# Patient Record
Sex: Female | Born: 1970 | Race: Black or African American | Hispanic: No | Marital: Single | Smoking: Current every day smoker
Health system: Southern US, Community
[De-identification: ages and names within clinical notes are randomized; demographics above are authoritative.]

## PROBLEM LIST (undated history)

## (undated) DIAGNOSIS — M1990 Unspecified osteoarthritis, unspecified site: Secondary | ICD-10-CM

---

## 2004-03-08 ENCOUNTER — Emergency Department: Payer: Self-pay | Admitting: Emergency Medicine

## 2007-01-06 ENCOUNTER — Emergency Department: Payer: Self-pay | Admitting: Emergency Medicine

## 2008-06-12 ENCOUNTER — Emergency Department: Payer: Self-pay | Admitting: Emergency Medicine

## 2010-11-28 ENCOUNTER — Emergency Department: Payer: Self-pay | Admitting: Emergency Medicine

## 2010-12-04 ENCOUNTER — Emergency Department: Payer: Self-pay | Admitting: Emergency Medicine

## 2011-04-07 ENCOUNTER — Emergency Department: Payer: Self-pay | Admitting: Emergency Medicine

## 2012-03-02 ENCOUNTER — Emergency Department: Payer: Self-pay | Admitting: Emergency Medicine

## 2012-09-24 ENCOUNTER — Emergency Department: Payer: Self-pay | Admitting: Emergency Medicine

## 2012-09-24 LAB — CBC
MCH: 31.2 pg (ref 26.0–34.0)
MCHC: 33.1 g/dL (ref 32.0–36.0)
MCV: 94 fL (ref 80–100)
Platelet: 184 10*3/uL (ref 150–440)
RBC: 4.51 10*6/uL (ref 3.80–5.20)
WBC: 3.6 10*3/uL (ref 3.6–11.0)

## 2012-09-24 LAB — SEDIMENTATION RATE: Erythrocyte Sed Rate: 12 mm/hr (ref 0–20)

## 2013-03-04 ENCOUNTER — Emergency Department: Payer: Self-pay | Admitting: Emergency Medicine

## 2013-03-04 LAB — BASIC METABOLIC PANEL
Anion Gap: 3 — ABNORMAL LOW (ref 7–16)
Chloride: 104 mmol/L (ref 98–107)
Co2: 29 mmol/L (ref 21–32)
Glucose: 80 mg/dL (ref 65–99)
Potassium: 3.8 mmol/L (ref 3.5–5.1)

## 2013-03-04 LAB — CBC
HCT: 41.1 % (ref 35.0–47.0)
MCHC: 34 g/dL (ref 32.0–36.0)
RBC: 4.49 10*6/uL (ref 3.80–5.20)
RDW: 13.6 % (ref 11.5–14.5)

## 2013-03-04 LAB — SEDIMENTATION RATE: Erythrocyte Sed Rate: 10 mm/hr (ref 0–20)

## 2013-09-09 ENCOUNTER — Emergency Department: Payer: Self-pay | Admitting: Emergency Medicine

## 2013-09-09 LAB — CBC
HCT: 42.1 % (ref 35.0–47.0)
HGB: 14.1 g/dL (ref 12.0–16.0)
MCH: 30.2 pg (ref 26.0–34.0)
MCHC: 33.5 g/dL (ref 32.0–36.0)
MCV: 90 fL (ref 80–100)
Platelet: 221 10*3/uL (ref 150–440)
RBC: 4.67 10*6/uL (ref 3.80–5.20)
RDW: 13.7 % (ref 11.5–14.5)
WBC: 3.4 10*3/uL — AB (ref 3.6–11.0)

## 2013-09-09 LAB — CK TOTAL AND CKMB (NOT AT ARMC)
CK, Total: 48 U/L
CK-MB: 0.6 ng/mL (ref 0.5–3.6)

## 2013-09-09 LAB — COMPREHENSIVE METABOLIC PANEL
ALK PHOS: 68 U/L
Albumin: 3.1 g/dL — ABNORMAL LOW (ref 3.4–5.0)
Anion Gap: 5 — ABNORMAL LOW (ref 7–16)
BUN: 15 mg/dL (ref 7–18)
Bilirubin,Total: 0.6 mg/dL (ref 0.2–1.0)
CALCIUM: 8 mg/dL — AB (ref 8.5–10.1)
CHLORIDE: 107 mmol/L (ref 98–107)
CO2: 24 mmol/L (ref 21–32)
Creatinine: 0.73 mg/dL (ref 0.60–1.30)
EGFR (African American): >60
EGFR (Non-African Amer.): >60
GLUCOSE: 89 mg/dL (ref 65–99)
OSMOLALITY: 272 (ref 275–301)
Potassium: 3.5 mmol/L (ref 3.5–5.1)
SGOT(AST): 14 U/L — ABNORMAL LOW (ref 15–37)
SGPT (ALT): 15 U/L (ref 12–78)
Sodium: 136 mmol/L (ref 136–145)
TOTAL PROTEIN: 7.2 g/dL (ref 6.4–8.2)

## 2013-09-09 LAB — SEDIMENTATION RATE: ERYTHROCYTE SED RATE: 15 mm/h (ref 0–20)

## 2013-10-28 ENCOUNTER — Emergency Department: Payer: Self-pay | Admitting: Emergency Medicine

## 2013-10-28 LAB — URIC ACID: Uric Acid: 2.6 mg/dL (ref 2.6–6.0)

## 2013-12-28 ENCOUNTER — Emergency Department: Payer: Self-pay | Admitting: Internal Medicine

## 2013-12-28 LAB — COMPREHENSIVE METABOLIC PANEL
ALT: 14 U/L
AST: 17 U/L (ref 15–37)
Albumin: 3.2 g/dL — ABNORMAL LOW (ref 3.4–5.0)
Alkaline Phosphatase: 68 U/L
Anion Gap: 7 (ref 7–16)
BUN: 16 mg/dL (ref 7–18)
Bilirubin,Total: 0.5 mg/dL (ref 0.2–1.0)
CALCIUM: 8.8 mg/dL (ref 8.5–10.1)
Chloride: 106 mmol/L (ref 98–107)
Co2: 23 mmol/L (ref 21–32)
Creatinine: 0.81 mg/dL (ref 0.60–1.30)
EGFR (African American): >60
GLUCOSE: 97 mg/dL (ref 65–99)
Osmolality: 273 (ref 275–301)
Potassium: 3.5 mmol/L (ref 3.5–5.1)
Sodium: 136 mmol/L (ref 136–145)
Total Protein: 7.5 g/dL (ref 6.4–8.2)

## 2013-12-28 LAB — CBC
HCT: 39.6 % (ref 35.0–47.0)
HGB: 12.9 g/dL (ref 12.0–16.0)
MCH: 29.1 pg (ref 26.0–34.0)
MCHC: 32.5 g/dL (ref 32.0–36.0)
MCV: 89 fL (ref 80–100)
PLATELETS: 241 10*3/uL (ref 150–440)
RBC: 4.43 10*6/uL (ref 3.80–5.20)
RDW: 14.2 % (ref 11.5–14.5)
WBC: 4.1 10*3/uL (ref 3.6–11.0)

## 2013-12-28 LAB — URINALYSIS, COMPLETE
Bilirubin,UR: NEGATIVE
Blood: NEGATIVE
Glucose,UR: NEGATIVE mg/dL (ref 0–75)
KETONE: NEGATIVE
Nitrite: NEGATIVE
Ph: 6 (ref 4.5–8.0)
Protein: NEGATIVE
SPECIFIC GRAVITY: 1.021 (ref 1.003–1.030)
WBC UR: <1 /HPF (ref 0–5)

## 2013-12-28 LAB — DRUG SCREEN, URINE
AMPHETAMINES, UR SCREEN: NEGATIVE (ref ?–1000)
Barbiturates, Ur Screen: NEGATIVE (ref ?–200)
Benzodiazepine, Ur Scrn: NEGATIVE (ref ?–200)
CANNABINOID 50 NG, UR ~~LOC~~: NEGATIVE (ref ?–50)
Cocaine Metabolite,Ur ~~LOC~~: POSITIVE (ref ?–300)
MDMA (Ecstasy)Ur Screen: NEGATIVE (ref ?–500)
Methadone, Ur Screen: NEGATIVE (ref ?–300)
Opiate, Ur Screen: NEGATIVE (ref ?–300)
PHENCYCLIDINE (PCP) UR S: NEGATIVE (ref ?–25)
TRICYCLIC, UR SCREEN: NEGATIVE (ref ?–1000)

## 2013-12-28 LAB — SEDIMENTATION RATE: ERYTHROCYTE SED RATE: 16 mm/h (ref 0–20)

## 2014-01-30 ENCOUNTER — Emergency Department: Payer: Self-pay | Admitting: Emergency Medicine

## 2014-10-19 ENCOUNTER — Emergency Department
Admission: EM | Admit: 2014-10-19 | Discharge: 2014-10-19 | Disposition: A | Discharge status: Home / Self Care | Payer: Self-pay | Attending: Emergency Medicine | Admitting: Emergency Medicine

## 2014-10-19 ENCOUNTER — Encounter: Payer: Self-pay | Admitting: *Deleted

## 2014-10-19 DIAGNOSIS — G8929 Other chronic pain: Secondary | ICD-10-CM | POA: Insufficient documentation

## 2014-10-19 DIAGNOSIS — M2550 Pain in unspecified joint: Secondary | ICD-10-CM

## 2014-10-19 DIAGNOSIS — Z720 Tobacco use: Secondary | ICD-10-CM | POA: Insufficient documentation

## 2014-10-19 DIAGNOSIS — M25511 Pain in right shoulder: Secondary | ICD-10-CM | POA: Insufficient documentation

## 2014-10-19 DIAGNOSIS — M79641 Pain in right hand: Secondary | ICD-10-CM | POA: Insufficient documentation

## 2014-10-19 DIAGNOSIS — M79642 Pain in left hand: Secondary | ICD-10-CM | POA: Insufficient documentation

## 2014-10-19 HISTORY — DX: Unspecified osteoarthritis, unspecified site: M19.90

## 2014-10-19 MED ORDER — PREDNISONE 10 MG PO TABS: 10.0000 mg | ORAL_TABLET | Freq: Every day | ORAL | Status: AC

## 2014-10-19 MED ORDER — METHYLPREDNISOLONE SODIUM SUCC 125 MG IJ SOLR
80.0000 mg | Freq: Once | INTRAMUSCULAR | Status: AC
  Administered 2014-10-19: 80 mg via INTRAMUSCULAR

## 2014-10-19 MED ORDER — MORPHINE SULFATE 4 MG/ML IJ SOLN
INTRAMUSCULAR | Status: AC
  Administered 2014-10-19: 4 mg via INTRAMUSCULAR
  Filled 2014-10-19: qty 1

## 2014-10-19 MED ORDER — OXYCODONE-ACETAMINOPHEN 5-325 MG PO TABS: 1.0000 | ORAL_TABLET | ORAL | Status: DC | PRN

## 2014-10-19 MED ORDER — MORPHINE SULFATE 4 MG/ML IJ SOLN
4.0000 mg | Freq: Once | INTRAMUSCULAR | Status: AC
  Administered 2014-10-19: 4 mg via INTRAMUSCULAR

## 2014-10-19 MED ORDER — METHYLPREDNISOLONE SODIUM SUCC 125 MG IJ SOLR
INTRAMUSCULAR | Status: AC
  Administered 2014-10-19: 80 mg via INTRAMUSCULAR
  Filled 2014-10-19: qty 2

## 2014-10-19 NOTE — ED Provider Notes (Signed)
[facility] Emergency Department Provider Note  ____________________________________________  Time seen: Approximately 5:33 AM  I have reviewed the triage vital signs and the nursing notes.   HISTORY  Chief Complaint Shoulder Pain    HPI Katherine Greene is a 44 y.o. female who presents with exacerbation of 10/10 right shoulder pain 2-1/2 weeks with no known injury. 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 Patient had a positive AMA and is scheduled to follow up with rheumatology. Patient states in the past she has taken prednisone 10 mg with good relief of symptoms. She has been out of prednisone for several months. She was prescribed Voltaren 75 mg twice a day which she says does not relieve her pain.Denies fever, chills, chest pain, shortness of breath, abdominal pain, vomiting, diarrhea, headache, weakness, numbness, tingling.   Past Medical History  Diagnosis Date  . Arthritis     There are no active problems to display for this patient.   History reviewed. No pertinent past surgical history.    Allergies Review of patient's allergies indicates no known allergies.  No family history on file.  Social History History  Substance Use Topics  . Smoking status: Current Every Day Smoker -- 1.00 packs/day for 10 years    Types: Cigarettes  . Smokeless tobacco: Not on file  . Alcohol Use: Yes     Comment: special occasion    Review of Systems Constitutional: No fever/chills Eyes: No visual changes. ENT: No sore throat. Cardiovascular: Denies chest pain. Respiratory: Denies shortness of breath. Gastrointestinal: No abdominal pain.  No nausea, no vomiting.  No diarrhea.  No constipation. Genitourinary: Negative for dysuria. Musculoskeletal: Positive for chronic multiple joint pain; particularly right shoulder 2-1/2 weeks. Skin: Negative for  rash. Neurological: Negative for headaches, focal weakness or numbness.  10-point ROS otherwise negative.  ____________________________________________   PHYSICAL EXAM:  VITAL SIGNS: ED Triage Vitals  Enc Vitals Group     BP 10/19/14 0405 126/84 mmHg     Pulse Rate 10/19/14 0405 82     Resp 10/19/14 0405 18     Temp 10/19/14 0405 98.2 F (36.8 C)     Temp Source 10/19/14 0405 Oral     SpO2 10/19/14 0405 100 %     Weight 10/19/14 0405 135 lb (61.236 kg)     Height 10/19/14 0405  (1.626 m)     Head Cir --      Peak Flow --      Pain Score 10/19/14 0406 10     Pain Loc --      Pain Edu? --      Excl. in GC? --     Constitutional: Alert and oriented. Well appearing and in mild acute distress. Eyes: Conjunctivae are normal. PERRL. EOMI. Head: Atraumatic. Nose: No congestion/rhinnorhea. Mouth/Throat: Mucous membranes are moist.  Oropharynx non-erythematous. Neck: No stridor.   Cardiovascular: Normal rate, regular rhythm. Grossly normal heart sounds.  Good peripheral circulation. Respiratory: Normal respiratory effort.  No retractions. Lungs CTAB. Gastrointestinal: Soft and nontender. No distention. No abdominal bruits. No CVA tenderness. Musculoskeletal: Right shoulder tender to palpation without deformity. Limited range of motion due to pain. Multiple arthritic phalanges bilateral hands. No lower extremity tenderness nor edema.  No joint effusions. Neurologic:  Normal speech and language. No gross focal neurologic deficits are appreciated. Speech is normal. No gait instability. Skin:  Skin is warm, dry and intact.  No rash noted. Psychiatric: Mood and affect are normal. Speech and behavior are normal.  ____________________________________________   LABS (all labs ordered are listed, but only abnormal results are displayed)  Labs Reviewed - No data to  display ____________________________________________  EKG  None ____________________________________________  RADIOLOGY  None ____________________________________________   PROCEDURES  Procedure(s) performed: None  Critical Care performed: No  ____________________________________________   INITIAL IMPRESSION / ASSESSMENT AND PLAN / ED COURSE  Pertinent labs & imaging results that were available during my care of the patient were reviewed by me and considered in my medical decision making (see chart for details).  44 year old female with multiple joint pain, especially in right shoulder undergoing rheumatological workup. Will add analgesic to NSAID, starts low dose prednisone, patient will follow-up with [facility] rheumatology as scheduled. Strict return precautions given. Patient verbalizes understanding and agrees with plan of care. ____________________________________________   FINAL CLINICAL IMPRESSION(S) / ED DIAGNOSES  Final diagnoses:  Right shoulder pain  Multiple joint pain  Arthralgia      Irean Hong, MD 10/19/14 [phone]

## 2014-10-19 NOTE — Discharge Instructions (Signed)
1. Continue Voltaren as previously prescribed. 2. You may take Percocet (#20) as needed for more severe pain. 3. Start prednisone 10 mg daily (#15). 4. Return to the ER for worsening symptoms, fever, persistent vomiting, difficulty breathing or other concerns.  Shoulder Pain The shoulder is the joint that connects your arm to your body. Muscles and band-like tissues that connect bones to muscles (tendons) hold the joint together. Shoulder pain is felt if an injury or medical problem affects one or more parts of the shoulder. HOME CARE   Put ice on the sore area.  Put ice in a plastic bag.  Place a towel between your skin and the bag.  Leave the ice on for 15-20 minutes, 03-04 times a day for the first 2 days.  Stop using cold packs if they do not help with the pain.  If you were given something to keep your shoulder from moving (sling; shoulder immobilizer), wear it as told. Only take it off to shower or bathe.  Move your arm as little as possible, but keep your hand moving to prevent puffiness (swelling).  Squeeze a soft ball or foam pad as much as possible to help prevent swelling.  Take medicine as told by your doctor. GET HELP IF:  You have progressing new pain in your arm, hand, or fingers.  Your hand or fingers get cold.  Your medicine does not help lessen your pain. GET HELP RIGHT AWAY IF:   Your arm, hand, or fingers are numb or tingling.  Your arm, hand, or fingers are puffy (swollen), painful, or turn white or blue. MAKE SURE YOU:   Understand these instructions.  Will watch your condition.  Will get help right away if you are not doing well or get worse. Document Released: 09/30/2007 Document Revised: 08/28/2013 Document Reviewed: 10/26/2011 [facility] Patient Information 2015 [place], [place]. This information is not intended to replace advice given to you by your health care provider. Make sure you discuss any questions you have with your health care  provider.  Arthralgia Your caregiver has diagnosed you as suffering from an arthralgia. Arthralgia means there is pain in a joint. This can come from many reasons including:  Bruising the joint which causes soreness (inflammation) in the joint.  Wear and tear on the joints which occur as we grow older (osteoarthritis).  Overusing the joint.  Various forms of arthritis.  Infections of the joint. Regardless of the cause of pain in your joint, most of these different pains respond to anti-inflammatory drugs and rest. The exception to this is when a joint is infected, and these cases are treated with antibiotics, if it is a bacterial infection. HOME CARE INSTRUCTIONS   Rest the injured area for as long as directed by your caregiver. Then slowly start using the joint as directed by your caregiver and as the pain allows. Crutches as directed may be useful if the ankles, knees or hips are involved. If the knee was splinted or casted, continue use and care as directed. If an stretchy or elastic wrapping bandage has been applied today, it should be removed and re-applied every 3 to 4 hours. It should not be applied tightly, but firmly enough to keep swelling down. Watch toes and feet for swelling, bluish discoloration, coldness, numbness or excessive pain. If any of these problems (symptoms) occur, remove the ace bandage and re-apply more loosely. If these symptoms persist, contact your caregiver or return to this location.  For the first 24 hours, keep the  injured extremity elevated on pillows while lying down.  Apply ice for 15-20 minutes to the sore joint every couple hours while awake for the first half day. Then 03-04 times per day for the first 48 hours. Put the ice in a plastic bag and place a towel between the bag of ice and your skin.  Wear any splinting, casting, elastic bandage applications, or slings as instructed.  Only take over-the-counter or prescription medicines for pain,  discomfort, or fever as directed by your caregiver. Do not use aspirin immediately after the injury unless instructed by your physician. Aspirin can cause increased bleeding and bruising of the tissues.  If you were given crutches, continue to use them as instructed and do not resume weight bearing on the sore joint until instructed. Persistent pain and inability to use the sore joint as directed for more than 2 to 3 days are warning signs indicating that you should see a caregiver for a follow-up visit as soon as possible. Initially, a hairline fracture (break in bone) may not be evident on X-rays. Persistent pain and swelling indicate that further evaluation, non-weight bearing or use of the joint (use of crutches or slings as instructed), or further X-rays are indicated. X-rays may sometimes not show a small fracture until a week or 10 days later. Make a follow-up appointment with your own caregiver or one to whom we have referred you. A radiologist (specialist in reading X-rays) may read your X-rays. Make sure you know how you are to obtain your X-ray results. Do not assume everything is normal if you do not hear from [place]. SEEK MEDICAL CARE IF: Bruising, swelling, or pain increases. SEEK IMMEDIATE MEDICAL CARE IF:   Your fingers or toes are numb or blue.  The pain is not responding to medications and continues to stay the same or get worse.  The pain in your joint becomes severe.  You develop a fever over 102 F (38.9 C).  It becomes impossible to move or use the joint. MAKE SURE YOU:   Understand these instructions.  Will watch your condition.  Will get help right away if you are not doing well or get worse. Document Released: 04/13/2005 Document Revised: 07/06/2011 Document Reviewed: 11/30/2007 [facility] Patient Information 2015 [place], [place]. This information is not intended to replace advice given to you by your health care provider. Make sure you discuss any questions you have with  your health care provider.

## 2014-10-19 NOTE — ED Notes (Signed)
Pt c/o right shoulder pain x 2.5 weeks with no injury. Pt was seen for the same at [facility] on 6/13 and was dx with arthritis. Pt states she took with medication they gave her tonight without relief. Pt was given Volatren 75mg  EC BID. Pt also c/o pain in her knees and other joints.

## 2015-04-29 IMAGING — US US EXTREM UP VENOUS*L*
1 series · 13 of 24 positions shown · non-contrast
Comparison: None.

CLINICAL DATA: Swelling left arm.  Initially visit.



[Series 1: us extrem up venous*left* · 0.05mm/px · 13 of 39 slices shown]
[im 1/39]
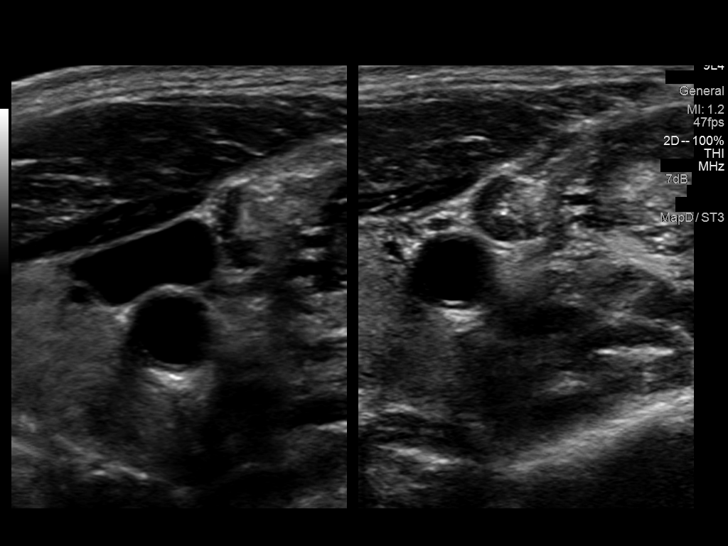
[im 4/39]
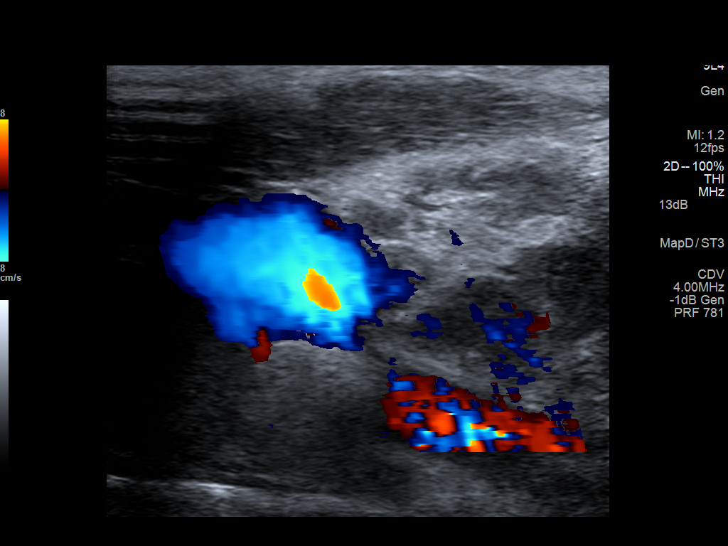
[im 7/39]
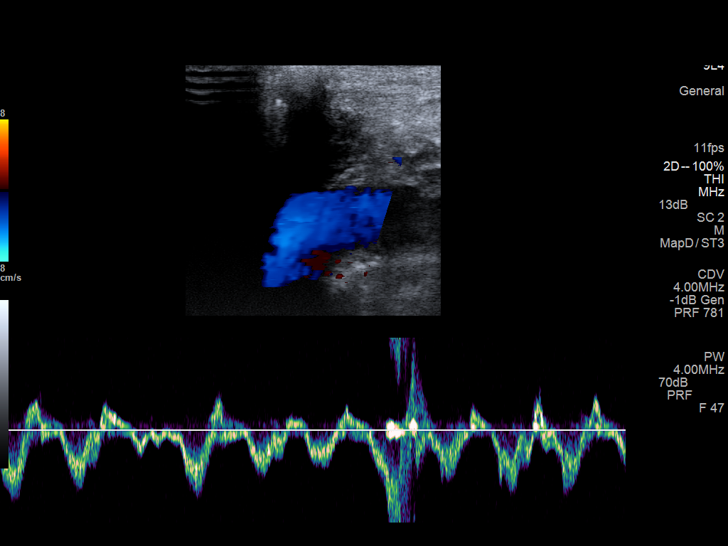
[im 10/39]
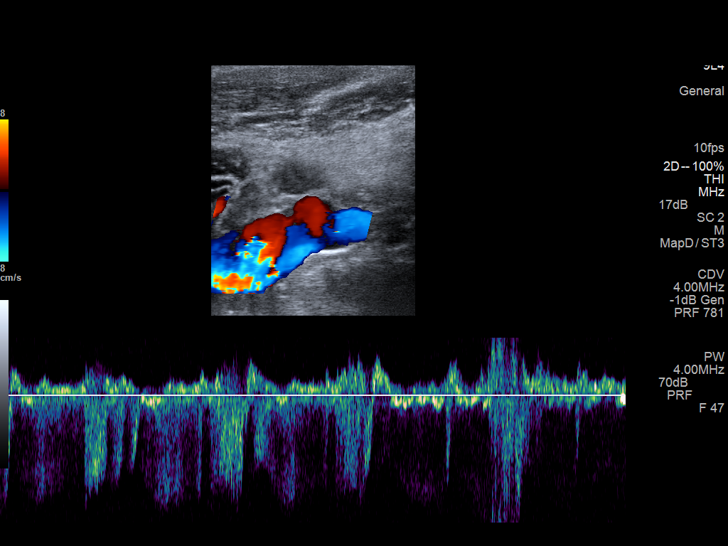
[im 14/39]
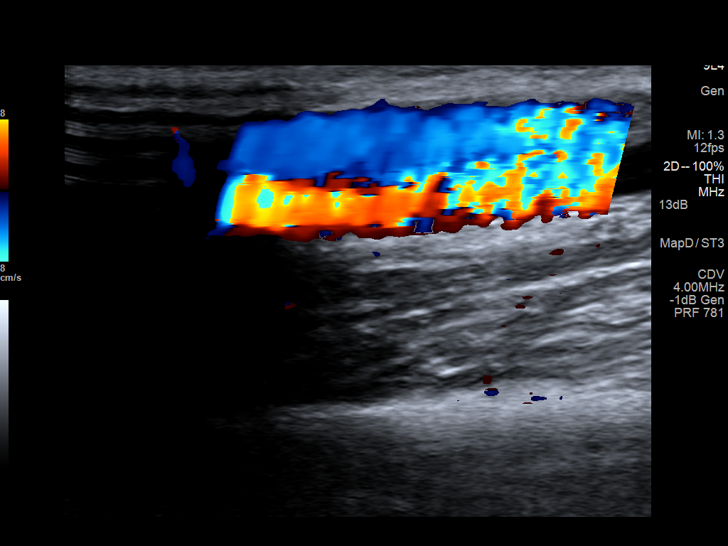
[im 17/39]
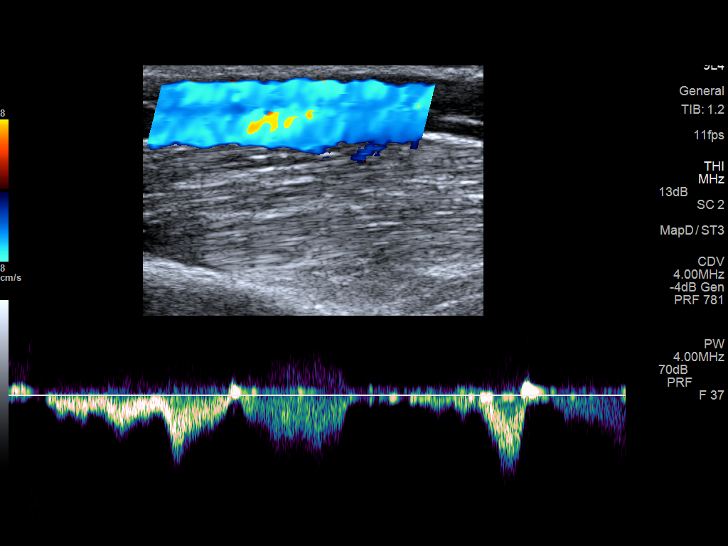
[im 20/39]
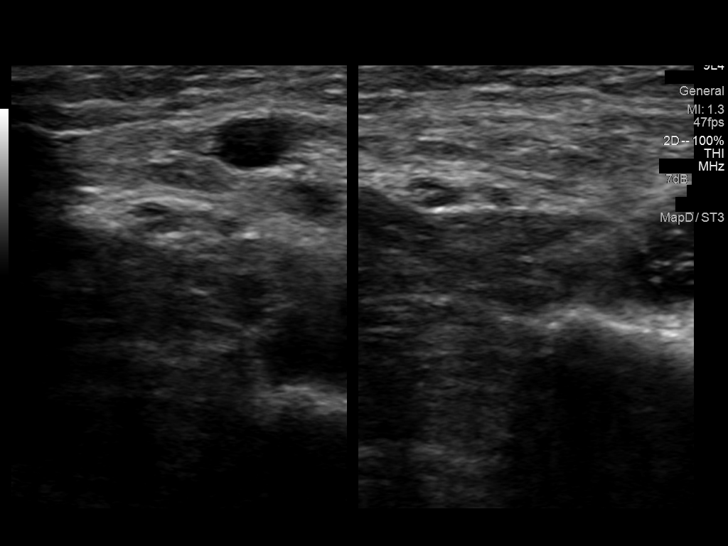
[im 22/39]
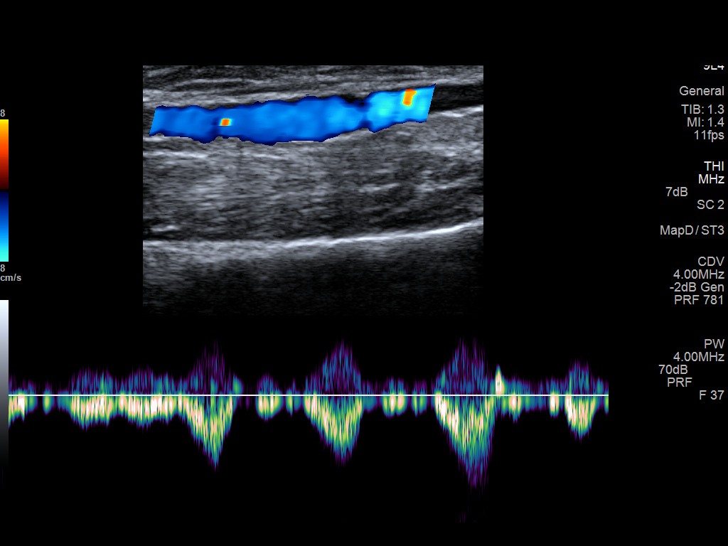
[im 25/39]
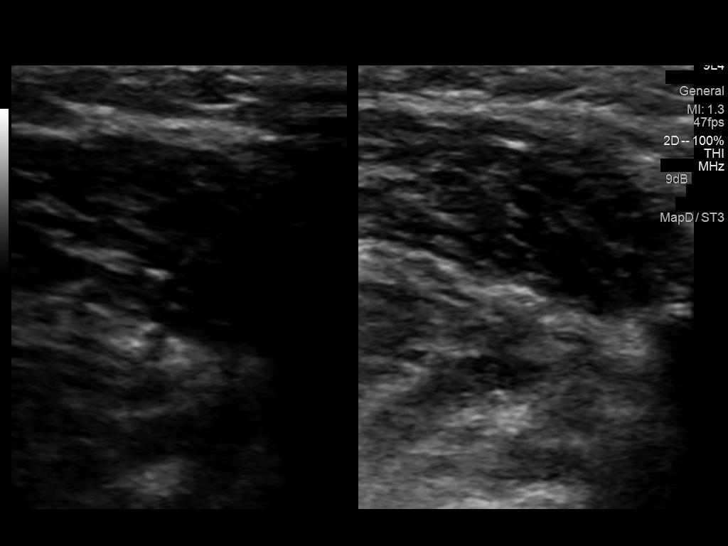
[im 29/39]
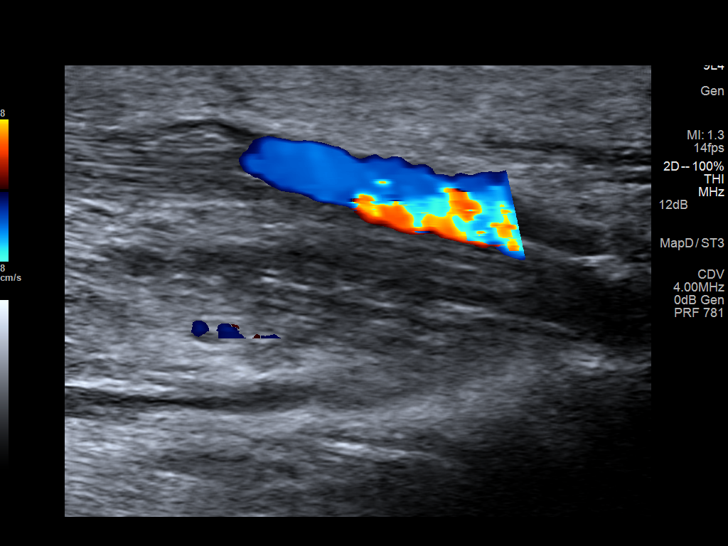
[im 32/39]
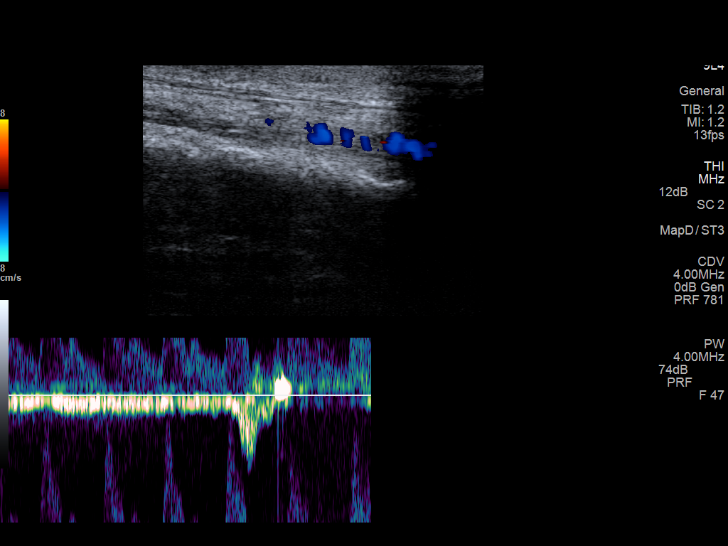
[im 35/39]
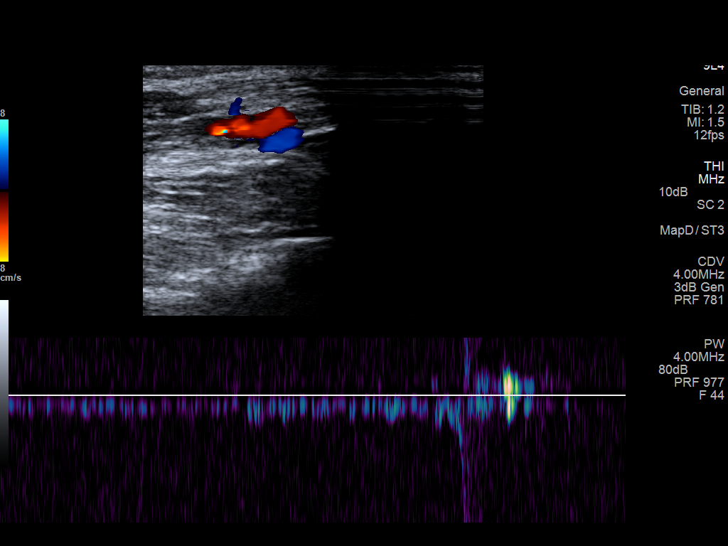
[im 39/39]
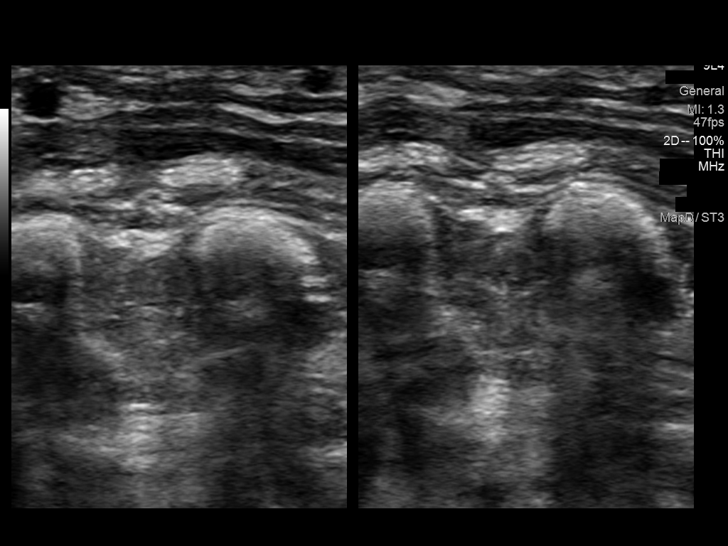

[13 of 24 positions shown; findings below may reference images not displayed]

FINDINGS: Internal Jugular Vein: No evidence of thrombus. Normal
compressibility, respiratory phasicity and response to augmentation.

Subclavian Vein: No evidence of thrombus. Normal compressibility,
respiratory phasicity and response to augmentation.

Axillary Vein: No evidence of thrombus. Normal compressibility,
respiratory phasicity and response to augmentation.

Cephalic Vein: No evidence of thrombus. Normal compressibility,
respiratory phasicity and response to augmentation.

Basilic Vein: No evidence of thrombus. Normal compressibility,
respiratory phasicity and response to augmentation.

Brachial Veins: No evidence of thrombus. Normal compressibility,
respiratory phasicity and response to augmentation.

Radial Veins: No evidence of thrombus. Normal compressibility,
respiratory phasicity and response to augmentation.

Ulnar Veins: No evidence of thrombus. Normal compressibility,
respiratory phasicity and response to augmentation.

Venous Reflux:  None visualized.

Other Findings:  None visualized.
IMPRESSION: No evidence of deep venous thrombosis.

## 2016-06-06 ENCOUNTER — Emergency Department
Admission: EM | Admit: 2016-06-06 | Discharge: 2016-06-06 | Disposition: A | Discharge status: Home / Self Care | Payer: Self-pay | Attending: Emergency Medicine | Admitting: Emergency Medicine

## 2016-06-06 ENCOUNTER — Encounter: Payer: Self-pay | Admitting: Emergency Medicine

## 2016-06-06 DIAGNOSIS — K047 Periapical abscess without sinus: Secondary | ICD-10-CM | POA: Insufficient documentation

## 2016-06-06 DIAGNOSIS — Z79899 Other long term (current) drug therapy: Secondary | ICD-10-CM | POA: Insufficient documentation

## 2016-06-06 DIAGNOSIS — F17210 Nicotine dependence, cigarettes, uncomplicated: Secondary | ICD-10-CM | POA: Insufficient documentation

## 2016-06-06 MED ORDER — NAPROXEN 500 MG PO TABS: 500.0000 mg | ORAL_TABLET | Freq: Two times a day (BID) | ORAL | 0 refills | Status: DC

## 2016-06-06 MED ORDER — HYDROCODONE-ACETAMINOPHEN 5-325 MG PO TABS
1.0000 | ORAL_TABLET | Freq: Once | ORAL | Status: AC
  Administered 2016-06-06: 1 via ORAL
  Filled 2016-06-06: qty 1

## 2016-06-06 MED ORDER — AMOXICILLIN 500 MG PO CAPS: 500.0000 mg | ORAL_CAPSULE | Freq: Three times a day (TID) | ORAL | 0 refills | Status: AC

## 2016-06-06 NOTE — ED Notes (Signed)
Pt verbalizes understanding of discharge instructions.

## 2016-06-06 NOTE — Discharge Instructions (Signed)
OPTIONS FOR DENTAL FOLLOW UP CARE ° °[place] [facility] - Local Safety Net Dental Clinics °http://www.ncdhhs.gov/dph/oralhealth/services/safetynetclinics.htm °  °[facility] (336-562-3123) ° °[facility] [place] (919-933-9087) ° °[facility] (919-663-1744 ext [phone]) ° °[facility] ([phone]) ° °[facility] ([phone]) °This clinic caters to the indigent population and is on a lottery system. °Location: °[facility], [address], [place] °Clinic Hours: °Wednesdays from 6pm - 9pm, patients seen by a lottery system. °For dates, call or go to www.med.[facility].edu/[facility]/patients/Dental-[facility] °Services: °Cleanings, fillings and simple extractions. °Payment Options: °DENTAL WORK IS FREE OF CHARGE. Bring proof of income or support. °Best way to get seen: °Arrive at 5:15 pm - this is a lottery, NOT first come/first serve, so arriving earlier will not increase your chances of being seen. °  °  °[facility] °919-537-3737 °Select option 1 for emergencies °  °Location: °[facility], [address], [place] °Clinic Hours: °No walk-ins accepted - call the day before to schedule an appointment. °Check in times are 9:30 am and 1:30 pm. °Services: °Simple extractions, temporary fillings, pulpectomy/pulp debridement, uncomplicated abscess drainage. °Payment Options: °PAYMENT IS DUE AT THE TIME OF SERVICE.  Fee is usually $100-200, additional surgical procedures (e.g. abscess drainage) may be extra. °Cash, checks, Visa/MasterCard accepted.  Can file Medicaid if patient is covered for dental - patient should call case worker to check. °No discount for [facility] patients. °Best way to get seen: °MUST call the day before and get onto the schedule. Can usually be seen the next 1-2 days. No walk-ins accepted. °  °  °[facility] °[phone] °   °Location: °[facility], [address], [place] °Clinic Hours: °M, W, Th, F 8am or 1:30pm, Tues 9a or 1:30 - first come/first served. °Services: °Simple extractions, temporary fillings, uncomplicated abscess drainage.  You do not need to be an [place] resident. °Payment Options: °PAYMENT IS DUE AT THE TIME OF SERVICE. °Dental insurance, otherwise sliding scale - bring proof of income or support. °Depending on income and treatment needed, cost is usually $50-200. °Best way to get seen: °Arrive early as it is first come/first served. °  °  °[facility] °[phone] °  °Location: °[address] °Clinic Hours: °Mon-Thu 8a-5p °Services: °Most basic dental services including extractions and fillings. °Payment Options: °PAYMENT IS DUE AT THE TIME OF SERVICE. °Sliding scale, up to 50% off - bring proof if income or support. °Medicaid with dental option accepted. °Best way to get seen: °Call to schedule an appointment, can usually be seen within 2 weeks OR they will try to see walk-ins - show up at 8a or 2p (you may have to wait). °  °  °[facility] °919-245-2435 °[place] RESIDENTS ONLY °  °Location: °[facility], [address], [place], [place] [address] °Clinic Hours: By appointment only. °Monday - Thursday 8am-5pm, Friday 8am-12pm °Services: Cleanings, fillings, extractions. °Payment Options: °PAYMENT IS DUE AT THE TIME OF SERVICE. °Cash, Visa or MasterCard. Sliding scale - $30 minimum per service. °Best way to get seen: °Come in to office, complete packet and make an appointment - need proof of income °or support monies for each household member and proof of [place] residence. °Usually takes about a month to get in. °  °  °[facility] °[phone] °  °Location: °[address]   [place] °Clinic Hours: Walk-in Urgent Care Dental Services are offered Monday-Friday  mornings only. °The numbers of emergencies accepted daily is limited to the number of °providers available. °Maximum 15 - Mondays, Wednesdays & Thursdays °Maximum 10 - Tuesdays & Fridays °Services: °You do not need to be a [place] resident to be seen for a dental emergency. °Emergencies are defined as pain, swelling, abnormal bleeding, or dental trauma. Walkins will receive x-rays if needed. °NOTE: Dental cleaning is not an emergency. °Payment Options: °PAYMENT IS DUE AT THE TIME OF SERVICE. °Minimum co-pay is $40.00 for uninsured patients. °Minimum co-pay is $3.00 for Medicaid with dental coverage. °Dental Insurance is accepted and must be presented at time of visit. °Medicare does not cover dental. °Forms of payment: Cash, credit card, checks. °Best way to get seen: °If not previously registered with the clinic, walk-in dental registration begins at 7:15 am and is on a first come/first serve basis. °If previously registered with the clinic, call to make an appointment. °  °  °[facility] °919-776-4359 °[place] RESIDENTS ONLY °  °Location: °[address], [place], [place] °Clinic Hours: °Mon-Thu 10a-2p °Services: Extractions only! °Payment Options: °FREE (donations accepted) - bring proof of income or support °Best way to get seen: °Call and schedule an appointment OR come at 8am on the 1st Monday of every month (except for holidays) when it is first come/first served. °  °  °[facility] °[phone] °  °Location: °[address], [place] °Clinic Hours: °Friday mornings °Services, Payment Options, Best way to get seen: °Call for info °

## 2016-06-06 NOTE — ED Provider Notes (Signed)
[facility] Emergency Department Provider Note  ____________________________________________  Time seen: Approximately 3:30 PM  I have reviewed the triage vital signs and the nursing notes.   HISTORY  Chief Complaint Facial Swelling    HPI Katherine Greene is a 46 y.o. female that presents to the emergency department with lower left tooth pain for 2 days. Patient states that she has not seen a dentist in many months.Patient states that it is very tender around one of her lower teeth. Patient states that she has two loose teeth in the front. Patient denies fever, drainage from mouth, jaw pain, ear pain, throat closing, cough, shortness of breath, chest pain, nausea, vomiting, diarrhea, constipation.   Past Medical History:  Diagnosis Date  . Arthritis     There are no active problems to display for this patient.   History reviewed. No pertinent surgical history.  Prior to Admission medications   Medication Sig Start Date End Date Taking? Authorizing Provider  amoxicillin (AMOXIL) 500 MG capsule Take 1 capsule (500 mg total) by mouth 3 (three) times daily. 06/06/16 06/16/16  Enid Derry, PA-C  diclofenac (VOLTAREN) 75 MG EC tablet Take 75 mg by mouth 2 (two) times daily.    Historical Provider, MD  naproxen (NAPROSYN) 500 MG tablet Take 1 tablet (500 mg total) by mouth 2 (two) times daily with a meal. 06/06/16 06/06/17  Enid Derry, PA-C  oxyCODONE-acetaminophen (ROXICET) 5-325 MG per tablet Take 1 tablet by mouth every 4 (four) hours as needed for severe pain. 10/19/14   Irean Hong, MD  predniSONE (DELTASONE) 10 MG tablet Take 1 tablet (10 mg total) by mouth daily. 10/19/14   Irean Hong, MD    Allergies Patient has no known allergies.  History reviewed. No pertinent family history.  Social History Social History  Substance Use Topics  . Smoking status: Current Every Day Smoker    Packs/day: 1.00    Years: 10.00    Types: Cigarettes  .  Smokeless tobacco: Never Used  . Alcohol use Yes     Comment: special occasion     Review of Systems  Constitutional: No fever/chills ENT: No upper respiratory complaints. Cardiovascular: No chest pain. Respiratory: No cough. No SOB. Gastrointestinal: No abdominal pain.  No nausea, no vomiting.  Musculoskeletal: Negative for musculoskeletal pain. Skin: Negative for rash, abrasions, lacerations, ecchymosis. Neurological: Negative for headaches, numbness or tingling   ____________________________________________   PHYSICAL EXAM:  VITAL SIGNS: ED Triage Vitals [06/06/16 1344]  Enc Vitals Group     BP 119/74     Pulse Rate 89     Resp 20     Temp 98.1 F (36.7 C)     Temp Source Oral     SpO2 100 %     Weight 145 lb (65.8 kg)     Height 5\' 4"  (1.626 m)     Head Circumference      Peak Flow      Pain Score 9     Pain Loc      Pain Edu?      Excl. in GC?      Constitutional: Alert and oriented. Well appearing and in no acute distress. Eyes: Conjunctivae are normal. PERRL. EOMI. Head: Atraumatic. ENT:      Ears:      Nose: No congestion/rhinnorhea.      Mouth/Throat: Mucous membranes are moist. Oropharynx non-erythematous. Tonsils not enlarged. Uvula midline. Tenderness to palpation around tooth 22. Tooth #24, 25 loose. No  areas of fluctuance around buccal or mucosal surfaces. No drainage from mouth. No TMJ pain. No trismus. No areas of swelling over cheek or chin. Neck: No stridor.   Cardiovascular: Normal rate, regular rhythm.  Good peripheral circulation. Respiratory: Normal respiratory effort without tachypnea or retractions. Lungs CTAB. Good air entry to the bases with no decreased or absent breath sounds. Musculoskeletal: Full range of motion to all extremities. No gross deformities appreciated. Neurologic:  Normal speech and language. No gross focal neurologic deficits are appreciated.  Skin:  Skin is warm, dry and intact. No rash  noted.   ____________________________________________   LABS (all labs ordered are listed, but only abnormal results are displayed)  Labs Reviewed - No data to display ____________________________________________  EKG   ____________________________________________  RADIOLOGY   No results found.  ____________________________________________    PROCEDURES  Procedure(s) performed:    Procedures    Medications  HYDROcodone-acetaminophen (NORCO/VICODIN) 5-325 MG per tablet 1 tablet (1 tablet Oral Given 06/06/16 1558)     ____________________________________________   INITIAL IMPRESSION / ASSESSMENT AND PLAN / ED COURSE  Pertinent labs & imaging results that were available during my care of the patient were reviewed by me and considered in my medical decision making (see chart for details).  Review of the [place] CSRS was performed in accordance of the [facility] prior to dispensing any controlled drugs.   Patient's diagnosis is consistent with dental infection. Vital signs and exam are reassuring. No indication of Ludwig's angina. No drainable abscess. Patient will be discharged home with prescriptions for amoxicillin and naproxen. Patient's family member came out to ask for a  prescription for Percocet or Vicodin for patient's pain. I explained that this was not indicated and was only going to give a prescription for naproxen. Patient is to follow up with dentist as directed. Patient is given ED precautions to return to the ED for any worsening or new symptoms.  ____________________________________________  FINAL CLINICAL IMPRESSION(S) / ED DIAGNOSES  Final diagnoses:  Tooth infection      NEW MEDICATIONS STARTED DURING THIS VISIT:  Discharge Medication List as of 06/06/2016  3:51 PM    START taking these medications   Details  amoxicillin (AMOXIL) 500 MG capsule Take 1 capsule (500 mg total) by mouth 3 (three) times daily., Starting Sat 06/06/2016, Until Tue  06/16/2016, Print    naproxen (NAPROSYN) 500 MG tablet Take 1 tablet (500 mg total) by mouth 2 (two) times daily with a meal., Starting Sat 06/06/2016, Until Sun 06/06/2017, Print            This chart was dictated using voice recognition software/Dragon. Despite best efforts to proofread, errors can occur which can change the meaning. Any change was purely unintentional.    Enid DerryAshley Kailoni Vahle, PA-C 06/06/16 2133    Phineas SemenGraydon Goodman, MD 06/06/16 2149

## 2016-06-06 NOTE — ED Triage Notes (Signed)
Pt to ed with c/o left sided facial swelling x 2 days. Denies difficulty breathing or swallowing.

## 2016-06-06 NOTE — ED Notes (Signed)
Pt reports she can't take Ibuprofen because of her liver RN and PA explained that if she has a problem with her liver Tylenol will be contraindicated pt reports she wants to take Vicodin, explained to f/u with her PCP

## 2017-04-03 ENCOUNTER — Emergency Department
Admission: EM | Admit: 2017-04-03 | Discharge: 2017-04-03 | Disposition: A | Discharge status: Home / Self Care | Payer: Self-pay | Attending: Emergency Medicine | Admitting: Emergency Medicine

## 2017-04-03 ENCOUNTER — Other Ambulatory Visit: Payer: Self-pay

## 2017-04-03 ENCOUNTER — Encounter: Payer: Self-pay | Admitting: Emergency Medicine

## 2017-04-03 DIAGNOSIS — F17210 Nicotine dependence, cigarettes, uncomplicated: Secondary | ICD-10-CM | POA: Insufficient documentation

## 2017-04-03 DIAGNOSIS — M25511 Pain in right shoulder: Secondary | ICD-10-CM | POA: Insufficient documentation

## 2017-04-03 DIAGNOSIS — Z79899 Other long term (current) drug therapy: Secondary | ICD-10-CM | POA: Insufficient documentation

## 2017-04-03 NOTE — ED Provider Notes (Signed)
[facility] Emergency Department Provider Note  ____________________________________________  Time seen: Approximately 8:51 AM  I have reviewed the triage vital signs and the nursing notes.   HISTORY  Chief Complaint Shoulder Pain    HPI Katherine Greene is a 46 y.o. female that presents to the emergency department for a work note.  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  She does not want an exam or any medications.  She is here for a work note.  Past Medical History:  Diagnosis Date  . Arthritis     There are no active problems to display for this patient.   History reviewed. No pertinent surgical history.  Prior to Admission medications   Medication Sig Start Date End Date Taking? Authorizing Provider  diclofenac (VOLTAREN) 75 MG EC tablet Take 75 mg by mouth 2 (two) times daily.    [provider]  naproxen (NAPROSYN) 500 MG tablet Take 1 tablet (500 mg total) by mouth 2 (two) times daily with a meal. 06/06/16 06/06/17  Enid DerryWagner, Bernice Mcauliffe, PA-C  oxyCODONE-acetaminophen (ROXICET) 5-325 MG per tablet Take 1 tablet by mouth every 4 (four) hours as needed for severe pain. 10/19/14   Irean HongSung, Jade J, MD  predniSONE (DELTASONE) 10 MG tablet Take 1 tablet (10 mg total) by mouth daily. 10/19/14   Irean HongSung, Jade J, MD    Allergies Patient has no known allergies.  No family history on file.  Social History Social History   Tobacco Use  . Smoking status: Current Every Day Smoker    Packs/day: 1.00    Years: 10.00    Pack years: 10.00    Types: Cigarettes  . Smokeless tobacco: Never Used  Substance Use Topics  . Alcohol use: Yes    Comment: special occasion  . Drug use: No     Review of Systems  Cardiovascular: No chest pain. Respiratory: No SOB. Gastrointestinal: No abdominal pain.  No nausea, no vomiting.  Musculoskeletal: Positive  for shoulder pain. Skin: Negative for rash, abrasions, lacerations, ecchymosis.   ____________________________________________   PHYSICAL EXAM:  VITAL SIGNS: ED Triage Vitals  Enc Vitals Group     BP 04/03/17 0827 114/78     Pulse Rate 04/03/17 0827 67     Resp 04/03/17 0827 18     Temp 04/03/17 0827 97.6 F (36.4 C)     Temp Source 04/03/17 0827 Oral     SpO2 04/03/17 0827 100 %     Weight 04/03/17 0829 140 lb (63.5 kg)     Height 04/03/17 0829 5\' 4"  (1.626 m)     Head Circumference --      Peak Flow --      Pain Score 04/03/17 0827 1     Pain Loc --      Pain Edu? --      Excl. in GC? --      Constitutional: Alert and oriented. Well appearing and in no acute distress. Eyes: Conjunctivae are normal. PERRL. EOMI. Head: Atraumatic. ENT:      Ears:      Nose: No congestion/rhinnorhea.      Mouth/Throat: Mucous membranes are moist.  Neck: No stridor.   Cardiovascular: Good peripheral circulation. Respiratory: Normal respiratory effort without tachypnea or retractions.Good air entry to the bases with no decreased or absent breath sounds. Musculoskeletal: Full range of motion to all extremities. No gross  deformities appreciated. Neurologic:  Normal speech and language. No gross focal neurologic deficits are appreciated.  Skin:  Skin is warm, dry and intact. No rash noted.   ____________________________________________   LABS (all labs ordered are listed, but only abnormal results are displayed)  Labs Reviewed - No data to display ____________________________________________  EKG   ____________________________________________  RADIOLOGY  No results found.  ____________________________________________    PROCEDURES  Procedure(s) performed:    Procedures    Medications - No data to display   ____________________________________________   INITIAL IMPRESSION / ASSESSMENT AND PLAN / ED COURSE  Pertinent labs & imaging results that were available  during my care of the patient were reviewed by me and considered in my medical decision making (see chart for details).  Review of the [place] CSRS was performed in accordance of the [facility] prior to dispensing any controlled drugs.     Patient presented to the emergency department for a work note.  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  She does not want an exam or any medications.  She is playing on her phone. Patient is to follow up with PCP as directed. Patient is given ED precautions to return to the ED for any worsening or new symptoms.     ____________________________________________  FINAL CLINICAL IMPRESSION(S) / ED DIAGNOSES  Final diagnoses:  Acute pain of right shoulder      NEW MEDICATIONS STARTED DURING THIS VISIT:  ED Discharge Orders    None          This chart was dictated using voice recognition software/Dragon. Despite best efforts to proofread, errors can occur which can change the meaning. Any change was purely unintentional.    Enid DerryWagner, Lashay Osborne, PA-C 04/03/17 [id number]    Nita SickleVeronese, [place], MD 04/03/17 [id number]

## 2017-04-03 NOTE — ED Triage Notes (Signed)
L shoulder pain with movement began this am, states started new job yesterday and doing lifting she is not used to.

## 2017-04-16 ENCOUNTER — Emergency Department
Admission: EM | Admit: 2017-04-16 | Discharge: 2017-04-16 | Disposition: A | Discharge status: Home / Self Care | Payer: MEDICAID

## 2017-04-16 ENCOUNTER — Emergency Department
Admission: EM | Admit: 2017-04-16 | Discharge: 2017-04-16 | Disposition: A | Discharge status: Home / Self Care | Payer: Self-pay | Attending: Emergency Medicine | Admitting: Emergency Medicine

## 2017-04-16 DIAGNOSIS — K219 Gastro-esophageal reflux disease without esophagitis: Secondary | ICD-10-CM | POA: Insufficient documentation

## 2017-04-16 DIAGNOSIS — Z79899 Other long term (current) drug therapy: Secondary | ICD-10-CM | POA: Insufficient documentation

## 2017-04-16 DIAGNOSIS — F17210 Nicotine dependence, cigarettes, uncomplicated: Secondary | ICD-10-CM | POA: Insufficient documentation

## 2017-04-16 MED ORDER — OMEPRAZOLE 20 MG PO CPDR: 20.0000 mg | DELAYED_RELEASE_CAPSULE | Freq: Every day | ORAL | 1 refills | Status: AC

## 2017-04-16 NOTE — ED Triage Notes (Signed)
Pt came to Ed via pov c/o heartburn. Reports has been vomiting. Vs stable. Pt takes ranitidine.

## 2017-04-16 NOTE — ED Provider Notes (Signed)
[facility] EMERGENCY DEPARTMENT Provider Note   CSN: [id number] Arrival date & time: 04/16/17  1622     History   Chief Complaint Chief Complaint  Patient presents with  . Heartburn    HPI Katherine Greene is a 46 y.o. female presents to the emergency department for evaluation of heartburn.  Patient states she was having moderate heartburn today after eating, she took ranitidine and symptoms resolved.  She denies any current pain.  She has had a history of reflux disease and only takes ranitidine as needed.  She has not tried any other medications.  Is currently states she is asymptomatic but is here for work note due to missing work today.  Patient describes the symptoms that she was having earlier today as a burning sensation throat that is worse with lying down.  She describes some retrosternal chest burning.  She denies any pressure or pain that is increased with exertion.  No nausea vomiting or diaphoresis.  Patient's reflux symptoms are present 2-3 days a week after eating and after lying down after eating.  HPI  Past Medical History:  Diagnosis Date  . Arthritis     There are no active problems to display for this patient.   History reviewed. No pertinent surgical history.  OB History    No data available       Home Medications    Prior to Admission medications   Medication Sig Start Date End Date Taking? Authorizing Provider  ranitidine (ZANTAC) 150 MG tablet Take 150 mg by mouth 2 (two) times daily.   Yes [provider]  omeprazole (PRILOSEC) 20 MG capsule Take 1 capsule (20 mg total) by mouth daily. 04/16/17 04/16/18  Evon SlackGaines, Thomas C, PA-C  predniSONE (DELTASONE) 10 MG tablet Take 1 tablet (10 mg total) by mouth daily. 10/19/14   Irean HongSung, Jade J, MD    Family History No family history on file.  Social History Social History   Tobacco Use  . Smoking status: Current Every Day Smoker    Packs/day: 1.00    Years: 10.00   Pack years: 10.00    Types: Cigarettes  . Smokeless tobacco: Never Used  Substance Use Topics  . Alcohol use: Yes    Comment: special occasion  . Drug use: No     Allergies   Patient has no known allergies.   Review of Systems Review of Systems  Constitutional: Negative for fever.  Respiratory: Negative for cough and shortness of breath.   Cardiovascular: Negative for chest pain (retrosternal burning improved with ranitiidine).  Gastrointestinal: Negative for abdominal pain, diarrhea, nausea and vomiting.  Genitourinary: Negative for difficulty urinating, dysuria and urgency.  Musculoskeletal: Negative for back pain and myalgias.  Skin: Negative for rash.  Neurological: Negative for dizziness and headaches.     Physical Exam Updated Vital Signs BP 136/71   Pulse 90   Temp 98.5 F (36.9 C) (Oral)   Resp 16   Ht 5\' 4"  (1.626 m)   Wt 63.5 kg (140 lb)   SpO2 100%   BMI 24.03 kg/m   Physical Exam  Constitutional: She is oriented to person, place, and time. She appears well-developed and well-nourished. No distress.  HENT:  Head: Normocephalic and atraumatic.  Mouth/Throat: Oropharynx is clear and moist. No oropharyngeal exudate.  Eyes: Conjunctivae are normal. Right eye exhibits no discharge. Left eye exhibits no discharge.  Neck: Normal range of motion.  Cardiovascular: Normal rate and regular rhythm.  Pulmonary/Chest: Effort normal and  breath sounds normal. No respiratory distress. She has no wheezes. She exhibits no tenderness.  Abdominal: Soft. She exhibits no distension. There is no tenderness. There is no guarding.  Musculoskeletal: Normal range of motion. She exhibits no deformity.  Neurological: She is alert and oriented to person, place, and time. She has normal reflexes.  Skin: Skin is warm and dry.  Psychiatric: She has a normal mood and affect. Her behavior is normal. Thought content normal.     ED Treatments / Results  Labs (all labs ordered are  listed, but only abnormal results are displayed) Labs Reviewed - No data to display  EKG  EKG Interpretation None       Radiology No results found.  Procedures Procedures (including critical care time)  Medications Ordered in ED Medications - No data to display   Initial Impression / Assessment and Plan / ED Course  I have reviewed the triage vital signs and the nursing notes.  Pertinent labs & imaging results that were available during my care of the patient were reviewed by me and considered in my medical decision making (see chart for details).     46 year old female with history of gastroesophageal reflux disease.  She occasionally takes ranitidine which helps with her symptoms but she does develop symptoms several days a week.  Patient has not tried proton pump inhibitor, will start omeprazole 20 mg daily prior to food.  Patient can continue with ranitidine after meals as needed.  Patient educated on signs and symptoms return to the clinic for.  Final Clinical Impressions(s) / ED Diagnoses   Final diagnoses:  Chronic GERD    ED Discharge Orders        Ordered    omeprazole (PRILOSEC) 20 MG capsule  Daily     04/16/17 1649       Ronnette JuniperGaines, Thomas C, PA-C 04/16/17 1700    Minna AntisPaduchowski, Kevin, MD 04/16/17 2303

## 2017-04-16 NOTE — Discharge Instructions (Signed)
Please take omeprazole daily.  Please review paperwork on foods to avoid to help with your reflux disease.  Please return to the emergency department for any increasing pain, worsening symptoms or urgent changes in her health.

## 2017-04-16 NOTE — ED Notes (Signed)
See triage note  States she ate something prior to going to work today  Developed heartburn  Hx of same  States she took zantac and now is feeling better

## 2017-05-05 ENCOUNTER — Other Ambulatory Visit: Payer: Self-pay

## 2017-05-05 ENCOUNTER — Emergency Department
Admission: EM | Admit: 2017-05-05 | Discharge: 2017-05-05 | Disposition: A | Discharge status: Home / Self Care | Payer: Self-pay | Attending: Emergency Medicine | Admitting: Emergency Medicine

## 2017-05-05 DIAGNOSIS — Z79899 Other long term (current) drug therapy: Secondary | ICD-10-CM | POA: Insufficient documentation

## 2017-05-05 DIAGNOSIS — R112 Nausea with vomiting, unspecified: Secondary | ICD-10-CM | POA: Insufficient documentation

## 2017-05-05 DIAGNOSIS — Z0279 Encounter for issue of other medical certificate: Secondary | ICD-10-CM | POA: Insufficient documentation

## 2017-05-05 DIAGNOSIS — F17210 Nicotine dependence, cigarettes, uncomplicated: Secondary | ICD-10-CM | POA: Insufficient documentation

## 2017-05-05 NOTE — ED Provider Notes (Signed)
[facility] Emergency Department Provider Note   ____________________________________________   First MD Initiated Contact with Patient 05/05/17 1214     (approximate)  I have reviewed the triage vital signs and the nursing notes.   HISTORY  Chief Complaint Pt requesting a work note    HPI Katherine Greene is a 47 y.o. female patient requesting return to work no secondary to being sent home from work for nausea and vomiting yesterday afternoon. Patient state has symptoms and complaints have resolved. Patient was told she needed medical clearance for returning back to work.  Past Medical History:  Diagnosis Date  . Arthritis     There are no active problems to display for this patient.   History reviewed. No pertinent surgical history.  Prior to Admission medications   Medication Sig Start Date End Date Taking? Authorizing Provider  omeprazole (PRILOSEC) 20 MG capsule Take 1 capsule (20 mg total) by mouth daily. 04/16/17 04/16/18  Evon Slack, PA-C  predniSONE (DELTASONE) 10 MG tablet Take 1 tablet (10 mg total) by mouth daily. 10/19/14   Irean Hong, MD  ranitidine (ZANTAC) 150 MG tablet Take 150 mg by mouth 2 (two) times daily.    [provider]    Allergies Patient has no known allergies.  History reviewed. No pertinent family history.  Social History Social History   Tobacco Use  . Smoking status: Current Every Day Smoker    Packs/day: 1.00    Years: 10.00    Pack years: 10.00    Types: Cigarettes  . Smokeless tobacco: Never Used  Substance Use Topics  . Alcohol use: Yes    Comment: special occasion  . Drug use: No    Review of Systems Constitutional: No fever/chills Eyes: No visual changes. ENT: No sore throat. Cardiovascular: Denies chest pain. Respiratory: Denies shortness of breath. Gastrointestinal: No abdominal pain.  No nausea, no vomiting.  No diarrhea.  No constipation. Genitourinary: Negative for  dysuria. Musculoskeletal: Negative for back pain. Skin: Negative for rash. Neurological: Negative for headaches, focal weakness or numbness.   ____________________________________________   PHYSICAL EXAM:  VITAL SIGNS: ED Triage Vitals   Enc Vitals Group     BP 126/74     Pulse Rate 87     Resp 16     Temp 97.8 F (36.6 C)     Temp Source Oral     SpO2 100 %     Weight 140 lb (63.5 kg)     Height 5\' 4"  (1.626 m)     Head Circumference      Peak Flow      Pain Score 0     Pain Loc      Pain Edu?      Excl. in GC?    Constitutional: Alert and oriented. Well appearing and in no acute distress. Cardiovascular: Normal rate, regular rhythm. Grossly normal heart sounds.  Good peripheral circulation. Respiratory: Normal respiratory effort.  No retractions. Lungs CTAB. Gastrointestinal: Soft and nontender. No distention. No abdominal bruits. No CVA tenderness. Neurologic:  Normal speech and language. No gross focal neurologic deficits are appreciated. No gait instability. Skin:  Skin is warm, dry and intact. No rash noted. Psychiatric: Mood and affect are normal. Speech and behavior are normal.  ____________________________________________   LABS (all labs ordered are listed, but only abnormal results are displayed)  Labs Reviewed - No data to display ____________________________________________  EKG   ____________________________________________  RADIOLOGY  No results found.  ____________________________________________   PROCEDURES  Procedure(s) performed: None  Procedures  Critical Care performed: No  ____________________________________________   INITIAL IMPRESSION / ASSESSMENT AND PLAN / ED COURSE  As part of my medical decision making, I reviewed the following data within the electronic MEDICAL RECORD NUMBER    Resolved nausea and vomiting. Patient given discharge care instruction return to work note. Patient advised [facility].      ____________________________________________   FINAL CLINICAL IMPRESSION(S) / ED DIAGNOSES  Final diagnoses:  Nausea and vomiting in adult     ED Discharge Orders    None       Note:  This document was prepared using Dragon voice recognition software and may include unintentional dictation errors.    Joni ReiningSmith, Damar Petit K, PA-C 05/05/17 1228    Emily FilbertWilliams, Jonathan E, MD 05/05/17 [phone]

## 2017-05-05 NOTE — ED Notes (Signed)
See triage note  States she was sent home from work yesterday for n/v  Last time vomited was 2 hours PTA  States she did have some abd pain but does not want to be seen for abd pain  Just needs a note to go back to work

## 2017-05-05 NOTE — ED Triage Notes (Signed)
Pt arrives to ED stating that yesterday she has abdominal pain and vomiting and again this morning. States her job is requiring a work note. Pt asked if she wants to be seen for abdominal pain and vomiting twice by this RN and pt stated no each time. Pt states she just needs a work note. No distress noted in triage. No pain. Alert, oriented, ambulatory.

## 2018-05-28 ENCOUNTER — Other Ambulatory Visit: Payer: Self-pay

## 2018-05-28 ENCOUNTER — Encounter: Payer: Self-pay | Admitting: Emergency Medicine

## 2018-05-28 ENCOUNTER — Emergency Department
Admission: EM | Admit: 2018-05-28 | Discharge: 2018-05-28 | Disposition: A | Discharge status: Home / Self Care | Payer: Self-pay | Attending: Emergency Medicine | Admitting: Emergency Medicine

## 2018-05-28 DIAGNOSIS — Z5321 Procedure and treatment not carried out due to patient leaving prior to being seen by health care provider: Secondary | ICD-10-CM | POA: Insufficient documentation

## 2018-05-28 DIAGNOSIS — M25569 Pain in unspecified knee: Secondary | ICD-10-CM | POA: Insufficient documentation

## 2018-05-28 NOTE — ED Notes (Signed)
Called once, no answer in WR.

## 2018-05-28 NOTE — ED Triage Notes (Signed)
Pt has chronic arthritis pain in both knees.  Today pain has been worse.  Pt left work and her work told her to get a work note.  No fevers. VSS. Ambulatory without distress.

## 2018-06-26 ENCOUNTER — Encounter: Payer: Self-pay | Admitting: Emergency Medicine

## 2018-06-26 ENCOUNTER — Emergency Department
Admission: EM | Admit: 2018-06-26 | Discharge: 2018-06-26 | Disposition: A | Discharge status: Home / Self Care | Payer: Self-pay | Attending: Emergency Medicine | Admitting: Emergency Medicine

## 2018-06-26 ENCOUNTER — Other Ambulatory Visit: Payer: Self-pay

## 2018-06-26 DIAGNOSIS — Z5321 Procedure and treatment not carried out due to patient leaving prior to being seen by health care provider: Secondary | ICD-10-CM | POA: Insufficient documentation

## 2018-06-26 DIAGNOSIS — R1110 Vomiting, unspecified: Secondary | ICD-10-CM | POA: Insufficient documentation

## 2018-06-26 DIAGNOSIS — R109 Unspecified abdominal pain: Secondary | ICD-10-CM | POA: Insufficient documentation

## 2018-06-26 LAB — COMPREHENSIVE METABOLIC PANEL
ALBUMIN: 3.9 g/dL (ref 3.5–5.0)
ALK PHOS: 54 U/L (ref 38–126)
ALT: 10 U/L (ref 0–44)
ANION GAP: 7 (ref 5–15)
AST: 10 U/L — ABNORMAL LOW (ref 15–41)
BUN: 17 mg/dL (ref 6–20)
CO2: 25 mmol/L (ref 22–32)
Calcium: 9 mg/dL (ref 8.9–10.3)
Chloride: 105 mmol/L (ref 98–111)
Creatinine, Ser: 0.82 mg/dL (ref 0.44–1.00)
GFR calc Af Amer: >60 mL/min (ref >60)
GLUCOSE: 95 mg/dL (ref 70–99)
Potassium: 3.6 mmol/L (ref 3.5–5.1)
Sodium: 137 mmol/L (ref 135–145)
TOTAL PROTEIN: 8.1 g/dL (ref 6.5–8.1)
Total Bilirubin: 0.5 mg/dL (ref 0.3–1.2)

## 2018-06-26 LAB — CBC
HCT: 47.5 % — ABNORMAL HIGH (ref 36.0–46.0)
Hemoglobin: 15.7 g/dL — ABNORMAL HIGH (ref 12.0–15.0)
MCH: 31.2 pg (ref 26.0–34.0)
MCHC: 33.1 g/dL (ref 30.0–36.0)
MCV: 94.4 fL (ref 80.0–100.0)
Platelets: 273 10*3/uL (ref 150–400)
RBC: 5.03 MIL/uL (ref 3.87–5.11)
RDW: 13.4 % (ref 11.5–15.5)
WBC: 4.3 10*3/uL (ref 4.0–10.5)
nRBC: 0 % (ref 0.0–0.2)

## 2018-06-26 LAB — LIPASE, BLOOD: Lipase: 22 U/L (ref 11–51)

## 2018-06-26 NOTE — ED Notes (Signed)
Called for room x 2.  

## 2018-06-26 NOTE — ED Notes (Signed)
Called for room x3 °

## 2018-06-26 NOTE — ED Triage Notes (Signed)
Pt arrives with complaints of lower abdominal cramping and 2 episodes of emesis that started this morning. Pt denies current nausea.

## 2018-06-26 NOTE — ED Notes (Signed)
Called for room x 1.  

## 2023-05-08 ENCOUNTER — Encounter: Payer: Self-pay | Admitting: Emergency Medicine

## 2023-05-08 ENCOUNTER — Ambulatory Visit
Admission: EM | Admit: 2023-05-08 | Discharge: 2023-05-08 | Disposition: A | Discharge status: Home / Self Care | Payer: Medicaid Other | Attending: Emergency Medicine | Admitting: Emergency Medicine

## 2023-05-08 DIAGNOSIS — J020 Streptococcal pharyngitis: Secondary | ICD-10-CM | POA: Diagnosis present

## 2023-05-08 LAB — SARS CORONAVIRUS 2 BY RT PCR: SARS Coronavirus 2 by RT PCR: NEGATIVE

## 2023-05-08 LAB — GROUP A STREP BY PCR: Group A Strep by PCR: DETECTED — AB

## 2023-05-08 MED ORDER — KETOROLAC TROMETHAMINE 30 MG/ML IJ SOLN
30.0000 mg | Freq: Once | INTRAMUSCULAR | Status: AC
  Administered 2023-05-08: 30 mg via INTRAMUSCULAR

## 2023-05-08 MED ORDER — AMOXICILLIN-POT CLAVULANATE 875-125 MG PO TABS: 1.0000 | ORAL_TABLET | Freq: Two times a day (BID) | ORAL | 0 refills | Status: AC

## 2023-05-08 NOTE — ED Provider Notes (Signed)
 [facility]    CSN: [id number] Arrival date & time: 05/08/23  1136      History   Chief Complaint Chief Complaint  Patient presents with   Sore Throat    HPI Katherine Greene is a 53 y.o. female.   HPI  53 year old female with a past medical history significant for arthritis presents for evaluation of 3 days worth of sore throat pain.  She reports that it is significantly painful to swallow and she does have a muffled voice in the exam room.  She denies fever, runny nose, nasal congestion, or ear pain.  She has been experiencing a nonproductive cough.  Past Medical History:  Diagnosis Date   Arthritis     There are no active problems to display for this patient.   History reviewed. No pertinent surgical history.  OB History   No obstetric history on file.      Home Medications    Prior to Admission medications   Medication Sig Start Date End Date Taking? Authorizing Provider  amoxicillin -clavulanate (AUGMENTIN ) 875-125 MG tablet Take 1 tablet by mouth every 12 (twelve) hours for 10 days. 05/08/23 05/18/23 Yes Bernardino Ditch, NP  hydrOXYzine (VISTARIL) 25 MG capsule Take 25 mg by mouth 3 (three) times daily. 04/29/23  Yes [provider]  sertraline (ZOLOFT) 50 MG tablet Take 50 mg by mouth daily. 04/29/23  Yes [provider]  omeprazole  (PRILOSEC) 20 MG capsule Take 1 capsule (20 mg total) by mouth daily. 04/16/17 04/16/18  Charlene Debby BROCKS, PA-C  predniSONE  (DELTASONE ) 10 MG tablet Take 1 tablet (10 mg total) by mouth daily. 10/19/14   Sung, Jade J, MD  ranitidine (ZANTAC) 150 MG tablet Take 150 mg by mouth 2 (two) times daily.    [provider]    Family History History reviewed. No pertinent family history.  Social History Social History   Tobacco Use   Smoking status: Every Day    Current packs/day: 1.00    Average packs/day: 1 pack/day for 10.0 years (10.0 ttl pk-yrs)    Types: Cigarettes   Smokeless tobacco: Never   Vaping Use   Vaping status: Never Used  Substance Use Topics   Alcohol use: Yes    Comment: special occasion   Drug use: No     Allergies   Patient has no known allergies.   Review of Systems Review of Systems  Constitutional:  Negative for fever.  HENT:  Positive for sore throat. Negative for congestion, ear pain and rhinorrhea.   Respiratory:  Positive for cough. Negative for shortness of breath and wheezing.      Physical Exam Triage Vital Signs ED Triage Vitals  Encounter Vitals Group     BP 05/08/23 1143 122/79     Systolic BP Percentile --      Diastolic BP Percentile --      Pulse Rate 05/08/23 1143 99     Resp 05/08/23 1143 15     Temp 05/08/23 1143 98.5 F (36.9 C)     Temp Source 05/08/23 1143 Oral     SpO2 05/08/23 1143 95 %     Weight 05/08/23 1142 139 lb 15.9 oz (63.5 kg)     Height 05/08/23 1142 5' 4 (1.626 m)     Head Circumference --      Peak Flow --      Pain Score 05/08/23 1142 9     Pain Loc --      Pain Education --  Exclude from Growth Chart --    No data found.  Updated Vital Signs BP 122/79 (BP Location: Right Arm)   Pulse 99   Temp 98.5 F (36.9 C) (Oral)   Resp 15   Ht 5' 4 (1.626 m)   Wt 139 lb 15.9 oz (63.5 kg)   LMP 05/06/2014   SpO2 95%   BMI 24.03 kg/m   Visual Acuity Right Eye Distance:   Left Eye Distance:   Bilateral Distance:    Right Eye Near:   Left Eye Near:    Bilateral Near:     Physical Exam Vitals and nursing note reviewed.  Constitutional:      Appearance: Normal appearance. She is not ill-appearing.  HENT:     Head: Normocephalic and atraumatic.     Mouth/Throat:     Mouth: Mucous membranes are moist.     Pharynx: Oropharynx is clear. Posterior oropharyngeal erythema present. No oropharyngeal exudate.     Comments: Tonsillar pillars are 2+ edematous and erythematous bilaterally.  No appreciable exudate. Cardiovascular:     Rate and Rhythm: Normal rate and regular rhythm.     Pulses:  Normal pulses.     Heart sounds: Normal heart sounds. No murmur heard.    No friction rub. No gallop.  Pulmonary:     Effort: Pulmonary effort is normal.     Breath sounds: Normal breath sounds. No wheezing, rhonchi or rales.  Musculoskeletal:     Cervical back: Normal range of motion and neck supple. No tenderness.  Lymphadenopathy:     Cervical: No cervical adenopathy.  Skin:    General: Skin is warm and dry.     Capillary Refill: Capillary refill takes less than 2 seconds.     Findings: No rash.  Neurological:     General: No focal deficit present.     Mental Status: She is alert and oriented to person, place, and time.      UC Treatments / Results  Labs (all labs ordered are listed, but only abnormal results are displayed) Labs Reviewed  GROUP A STREP BY PCR - Abnormal; Notable for the following components:      Result Value   Group A Strep by PCR DETECTED (*)    All other components within normal limits  SARS CORONAVIRUS 2 BY RT PCR    EKG   Radiology No results found.  Procedures Procedures (including critical care time)  Medications Ordered in UC Medications  ketorolac  (TORADOL ) 30 MG/ML injection 30 mg (30 mg Intramuscular Given 05/08/23 1218)    Initial Impression / Assessment and Plan / UC Course  I have reviewed the triage vital signs and the nursing notes.  Pertinent labs & imaging results that were available during my care of the patient were reviewed by me and considered in my medical decision making (see chart for details).   Patient is a pleasant, nontoxic-appearing 53 year old female presenting for evaluation of 3 days with a sore throat as outlined HPI above.  She does have a muffled voice in triage and she is complaining of a significantly sore throat with painful swallowing.  She states that when she lays down at night she has a cough and it is nonproductive.  She denies any URI symptoms.  On exam she is able to open her mouth there is no evidence  of trismus.  She does have edematous and erythematous tonsillar pillars without appreciable exudate.  No anterior cervical lymphadenopathy present on exam and no sob mental or  submandibular fullness to suggest possible peritonsillar abscess.  Cardiopulmonary exam is benign.  I will order a strep PCR as well as a COVID PCR.  She reports that she has not take anything for pain today so I will order 30 mg of IM Toradol  to be administered in clinic.  Strep PCR is positive.  COVID PCR is negative.  I will discharge patient home with a diagnosis of strep pharyngitis on Augmentin  875 mg twice daily with food for 10 days.  Tylenol  and/or ibuprofen as needed for fever or pain.  Salt water gargles and Chloraseptic or Sucrets lozenges for sore throat pain.  Return precautions reviewed.   Final Clinical Impressions(s) / UC Diagnoses   Final diagnoses:  Strep pharyngitis     Discharge Instructions      Take the Augmentin  twice daily for 10 days for treatment of your strep throat.  Gargle with warm salt water 2-3 times a day to soothe your throat, aid in pain relief, and aid in healing.  Take over-the-counter ibuprofen according to the package instructions as needed for pain.  You can also use Chloraseptic or Sucrets lozenges, 1 lozenge every 2 hours as needed for throat pain.  If you develop any new or worsening symptoms return for reevaluation.      ED Prescriptions     Medication Sig Dispense Auth. Provider   amoxicillin -clavulanate (AUGMENTIN ) 875-125 MG tablet Take 1 tablet by mouth every 12 (twelve) hours for 10 days. 20 tablet Bernardino Ditch, NP      PDMP not reviewed this encounter.   Bernardino Ditch, NP 05/08/23 1222

## 2023-05-08 NOTE — ED Triage Notes (Signed)
 Patient c/o sore throat for 3 days,.  Patient unsure of fevers.

## 2023-05-08 NOTE — Discharge Instructions (Addendum)
 Take the Augmentin twice daily for 10 days for treatment of your strep throat.  Gargle with warm salt water 2-3 times a day to soothe your throat, aid in pain relief, and aid in healing.  Take over-the-counter ibuprofen according to the package instructions as needed for pain.  You can also use Chloraseptic or Sucrets lozenges, 1 lozenge every 2 hours as needed for throat pain.  If you develop any new or worsening symptoms return for reevaluation.
